# Patient Record
Sex: Male | Born: 1995 | Marital: Single | Smoking: Never smoker
Health system: Southern US, Community
[De-identification: ages and names within clinical notes are randomized; demographics above are authoritative.]

## PROBLEM LIST (undated history)

## (undated) HISTORY — PX: SHOULDER SURGERY: SHX246

---

## 2019-02-10 ENCOUNTER — Other Ambulatory Visit: Payer: Self-pay

## 2019-02-10 DIAGNOSIS — Z20822 Contact with and (suspected) exposure to covid-19: Secondary | ICD-10-CM

## 2019-02-11 LAB — NOVEL CORONAVIRUS, NAA: SARS-CoV-2, NAA: NOT DETECTED

## 2019-06-14 ENCOUNTER — Other Ambulatory Visit: Payer: Self-pay | Admitting: *Deleted

## 2019-06-14 DIAGNOSIS — U071 COVID-19: Secondary | ICD-10-CM

## 2019-06-20 ENCOUNTER — Other Ambulatory Visit: Payer: Self-pay

## 2019-06-20 ENCOUNTER — Other Ambulatory Visit: Payer: No Typology Code available for payment source | Admitting: *Deleted

## 2019-06-20 ENCOUNTER — Ambulatory Visit (HOSPITAL_COMMUNITY): Payer: BC Managed Care – PPO | Attending: Cardiology

## 2019-06-20 DIAGNOSIS — U071 COVID-19: Secondary | ICD-10-CM | POA: Diagnosis present

## 2019-06-20 LAB — TROPONIN I (HIGH SENSITIVITY): Troponin I (High Sensitivity): 3 ng/L (ref <18)

## 2019-12-06 ENCOUNTER — Other Ambulatory Visit: Payer: Self-pay

## 2019-12-06 ENCOUNTER — Encounter (HOSPITAL_COMMUNITY): Payer: Self-pay | Admitting: *Deleted

## 2019-12-06 ENCOUNTER — Emergency Department (HOSPITAL_COMMUNITY)
Admission: EM | Admit: 2019-12-06 | Discharge: 2019-12-06 | Disposition: A | Discharge status: Home / Self Care | Payer: No Typology Code available for payment source | Attending: Emergency Medicine | Admitting: Emergency Medicine

## 2019-12-06 ENCOUNTER — Emergency Department (HOSPITAL_COMMUNITY): Payer: No Typology Code available for payment source

## 2019-12-06 DIAGNOSIS — S0083XA Contusion of other part of head, initial encounter: Secondary | ICD-10-CM | POA: Diagnosis not present

## 2019-12-06 DIAGNOSIS — S0990XA Unspecified injury of head, initial encounter: Secondary | ICD-10-CM | POA: Diagnosis present

## 2019-12-06 DIAGNOSIS — Y92415 Exit ramp or entrance ramp of street or highway as the place of occurrence of the external cause: Secondary | ICD-10-CM | POA: Diagnosis not present

## 2019-12-06 DIAGNOSIS — Y999 Unspecified external cause status: Secondary | ICD-10-CM | POA: Insufficient documentation

## 2019-12-06 DIAGNOSIS — Y93I9 Activity, other involving external motion: Secondary | ICD-10-CM | POA: Insufficient documentation

## 2019-12-06 DIAGNOSIS — S139XXA Sprain of joints and ligaments of unspecified parts of neck, initial encounter: Secondary | ICD-10-CM

## 2019-12-06 DIAGNOSIS — S80211A Abrasion, right knee, initial encounter: Secondary | ICD-10-CM | POA: Insufficient documentation

## 2019-12-06 DIAGNOSIS — S80212A Abrasion, left knee, initial encounter: Secondary | ICD-10-CM | POA: Diagnosis not present

## 2019-12-06 DIAGNOSIS — S0181XA Laceration without foreign body of other part of head, initial encounter: Secondary | ICD-10-CM | POA: Insufficient documentation

## 2019-12-06 MED ORDER — LIDOCAINE-EPINEPHRINE (PF) 2 %-1:200000 IJ SOLN
10.0000 mL | Freq: Once | INTRAMUSCULAR | Status: AC
  Administered 2019-12-06: 10 mL
  Filled 2019-12-06: qty 20

## 2019-12-06 NOTE — ED Provider Notes (Signed)
Signed out by Dr Hyacinth Meeker to f/u on MRI.   MRI neg for acute fx, note made of ligament sprain/strain.  Neurosurgery consulted - discussed pt and mri w Dr Venetia Maxon - he indicates cervical collar for next couple weeks - he will see in office in 1 week.   Patient comfortable. No current pain. No radicular pain. No numbness/weakness. Ambulatory about  ED.   Discussed plan w pt. No contact sports, collar, and ns f/u - pt agreeable.      Cathren Laine, MD 12/06/19 1958

## 2019-12-06 NOTE — Discharge Instructions (Addendum)
CT scan shows no broken bones and no bleeding on the brain. Note was made on MRI of sprain/strain to neck ligament. Wear cervical collar at all times. No contact sports until cleared to do so by neurosurgeon. Follow up with neurosurgeon in 1 week - call office tomorrow AM to verify time of appointment.  Your stitches will need to remain in place for at least a week - they will dissolve over time. Please follow up with the ENT specialist - Dr. Annalee Genta - he is aware of your visit and will follow up with you. If you develop increased pain / swelling / fever or pus from the wound you should be seen immediately It will swell a small amount -she keep ice and an antibiotic ointment on this throughout the next couple of days Your blood pressure is high today - follow up with primary care doctor in 1-2 weeks for recheck.  You will likely have a small amount of bleeding as well, you may keep this clean and change the dressing twice a day.  You will likely have some bruising of your face, this is to be expected as well.   Please do not engage in any athletic contact sports until the stitches have been removed or cleared by the specialist to return to full contact sports.  Keep the wound clean and dry for the next 7 days Tylenol, 500 mg and ibuprofen, 600 mg alternating every 4 hours as needed for pain  Emergency department for severe or worsening symptoms.
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
## 2020-06-02 ENCOUNTER — Ambulatory Visit
Admission: EM | Admit: 2020-06-02 | Discharge: 2020-06-02 | Disposition: A | Discharge status: Home / Self Care | Payer: Self-pay

## 2020-06-02 ENCOUNTER — Other Ambulatory Visit: Payer: Self-pay

## 2020-06-02 ENCOUNTER — Encounter: Payer: Self-pay | Admitting: Emergency Medicine

## 2020-06-02 DIAGNOSIS — S63641A Sprain of metacarpophalangeal joint of right thumb, initial encounter: Secondary | ICD-10-CM

## 2020-06-02 NOTE — ED Provider Notes (Signed)
[facility]    CSN: [id number] Arrival date & time: 06/02/20  0957      History   Chief Complaint Chief Complaint  Patient presents with  . thumb injury    right    HPI Franklin Kelley is a 24 y.o. male.   HPI   24 year old male here for evaluation of right thumb injury.  Patient reports that approximately 6 weeks ago he was playing football for [employer] and was attempting to tackle another player when he injured his thumb.  He is not sure the mechanism of the injury.  Patient has pain at the base of the thumb.  Patient has weakness in his grips and he cannot extend his thumb.  Patient can flex his thumb just fine there are no changes to sensation.  The area at the base of the thumb near the wrist is swollen.  History reviewed. No pertinent past medical history.  There are no problems to display for this patient.   Past Surgical History:  Procedure Laterality Date  . SHOULDER SURGERY         Home Medications    Prior to Admission medications   Not on File    Family History History reviewed. No pertinent family history.  Social History Social History   Tobacco Use  . Smoking status: Never Smoker  . Smokeless tobacco: Never Used  Vaping Use  . Vaping Use: Never used  Substance Use Topics  . Alcohol use: Yes  . Drug use: Never     Allergies   Patient has no known allergies.   Review of Systems Review of Systems  Constitutional: Negative for activity change and fever.  Musculoskeletal: Positive for joint swelling and myalgias. Negative for arthralgias.  Skin: Negative for color change and wound.  Neurological: Positive for weakness and numbness.  Hematological: Negative.   Psychiatric/Behavioral: Negative.      Physical Exam Triage Vital Signs ED Triage Vitals  Enc Vitals Group     BP      Pulse      Resp      Temp      Temp src      SpO2      Weight      Height      Head Circumference      Peak Flow      Pain Score      Pain  Loc      Pain Edu?      Excl. in GC?    No data found.  Updated Vital Signs BP 130/87 (BP Location: Left Arm)   Pulse 69   Temp 98.6 F (37 C) (Oral)   Resp 18   Ht 6\' 2"  (1.88 m)   Wt 190 lb 0.6 oz (86.2 kg)   SpO2 100%   BMI 24.40 kg/m   Visual Acuity Right Eye Distance:   Left Eye Distance:   Bilateral Distance:    Right Eye Near:   Left Eye Near:    Bilateral Near:     Physical Exam Vitals and nursing note reviewed.  Constitutional:      General: He is not in acute distress.    Appearance: Normal appearance. He is normal weight. He is not toxic-appearing.  HENT:     Head: Normocephalic and atraumatic.  Eyes:     General: No scleral icterus.    Extraocular Movements: Extraocular movements intact.     Conjunctiva/sclera: Conjunctivae normal.     Pupils: Pupils are equal,  round, and reactive to light.  Musculoskeletal:        General: Swelling, tenderness and signs of injury present.  Skin:    General: Skin is warm and dry.     Capillary Refill: Capillary refill takes less than 2 seconds.     Findings: No erythema or rash.  Neurological:     General: No focal deficit present.     Mental Status: He is alert and oriented to person, place, and time.  Psychiatric:        Mood and Affect: Mood normal.        Behavior: Behavior normal.        Thought Content: Thought content normal.        Judgment: Judgment normal.      UC Treatments / Results  Labs (all labs ordered are listed, but only abnormal results are displayed) Labs Reviewed - No data to display  EKG   Radiology No results found.  Procedures Procedures (including critical care time)  Medications Ordered in UC Medications - No data to display  Initial Impression / Assessment and Plan / UC Course  I have reviewed the triage vital signs and the nursing notes.  Pertinent labs & imaging results that were available during my care of the patient were reviewed by me and considered in my medical  decision making (see chart for details).   Is here for evaluation of a right thumb injury that happened approximately 6 weeks ago while playing football.  Patient states the time he was evaluated by the team trainer who told him he might have a sprain.  Since then he does continue to have pain and swelling to the base of the first metacarpal where it meets the scaphoid.  Patient has difficulty extending his thumb to give a thumbs up but can flex it across his palm without difficulty.  Patient does have pain in the same location as the swelling with forceful flexion or when resisting extension.  Patient has no bony tenderness or joint tenderness.  When pressing on the swollen area at the base of the first metacarpal there is definite laxity and a shift.  There is crepitus with palpation.  Suspect patient has ligamentous injury and I am referring to [facility] for evaluation.   Final Clinical Impressions(s) / UC Diagnoses   Final diagnoses:  Sprain of metacarpophalangeal (MCP) joint of right thumb, initial encounter     Discharge Instructions     Based on her physical exam there is concern for ligament damage.  I recommend you go to [facility] and access the urgent care.  You will most likely need an MRI to determine the extent of injury.    ED Prescriptions    None     PDMP not reviewed this encounter.   Becky Augusta, NP 06/02/20 1038

## 2020-06-02 NOTE — ED Triage Notes (Signed)
Pt c/o right thumb pain and swelling. Started about a month ago. He states he injured about 6 weeks ago but the swelling and pain has gotten worse.

## 2020-06-02 NOTE — Discharge Instructions (Addendum)
Based on her physical exam there is concern for ligament damage.  I recommend you go to [facility] and access the urgent care.  You will most likely need an MRI to determine the extent of injury.
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
